# Patient Record
Sex: Male | Born: 1985
Health system: Southern US, Community
[De-identification: ages and names within clinical notes are randomized; demographics above are authoritative.]

---

## 2011-09-19 ENCOUNTER — Ambulatory Visit: Payer: Self-pay

## 2011-10-22 ENCOUNTER — Ambulatory Visit: Payer: Self-pay | Admitting: Otolaryngology

## 2011-10-28 ENCOUNTER — Ambulatory Visit: Payer: Self-pay | Admitting: Unknown Physician Specialty

## 2011-10-28 LAB — BASIC METABOLIC PANEL
Anion Gap: 7 (ref 7–16)
Co2: 28 mmol/L (ref 21–32)
EGFR (African American): >60
EGFR (Non-African Amer.): >60
Glucose: 90 mg/dL (ref 65–99)
Sodium: 139 mmol/L (ref 136–145)

## 2011-10-28 LAB — CBC
HGB: 15.1 g/dL (ref 13.0–18.0)
MCH: 30.2 pg (ref 26.0–34.0)
MCHC: 34.5 g/dL (ref 32.0–36.0)
RDW: 12.6 % (ref 11.5–14.5)

## 2011-10-28 LAB — PROTIME-INR: INR: 0.9

## 2011-10-28 LAB — APTT: Activated PTT: 32.1 secs (ref 23.6–35.9)

## 2011-10-31 ENCOUNTER — Ambulatory Visit: Payer: Self-pay | Admitting: Otolaryngology

## 2011-10-31 LAB — BASIC METABOLIC PANEL
BUN: 12 mg/dL (ref 7–18)
Calcium, Total: 9.4 mg/dL (ref 8.5–10.1)
Chloride: 102 mmol/L (ref 98–107)
Creatinine: 1.01 mg/dL (ref 0.60–1.30)
EGFR (African American): >60
EGFR (Non-African Amer.): >60
Potassium: 3.6 mmol/L (ref 3.5–5.1)

## 2011-10-31 LAB — CBC WITH DIFFERENTIAL/PLATELET
Eosinophil #: 0.3 10*3/uL (ref 0.0–0.7)
Eosinophil %: 3.9 %
HCT: 43.7 % (ref 40.0–52.0)
Lymphocyte #: 2.3 10*3/uL (ref 1.0–3.6)
Lymphocyte %: 28.4 %
MCV: 87 fL (ref 80–100)
Monocyte %: 8.2 %
Neutrophil #: 4.7 10*3/uL (ref 1.4–6.5)
Neutrophil %: 58.7 %
Platelet: 276 10*3/uL (ref 150–440)
RBC: 5.02 10*6/uL (ref 4.40–5.90)
WBC: 8 10*3/uL (ref 3.8–10.6)

## 2011-10-31 LAB — PROTIME-INR
INR: 0.9
Prothrombin Time: 12.3 secs (ref 11.5–14.7)

## 2011-10-31 LAB — APTT: Activated PTT: 33.7 secs (ref 23.6–35.9)

## 2011-10-31 LAB — PLATELET FUNCTION ASSAY: COL/EPI PLT FXN SCRN: 152 Seconds — ABNORMAL HIGH

## 2012-04-16 ENCOUNTER — Inpatient Hospital Stay: Payer: Self-pay | Admitting: Surgery

## 2012-04-16 LAB — COMPREHENSIVE METABOLIC PANEL
Albumin: 4.1 g/dL (ref 3.4–5.0)
Alkaline Phosphatase: 112 U/L (ref 50–136)
Anion Gap: 8 (ref 7–16)
Creatinine: 0.99 mg/dL (ref 0.60–1.30)
SGOT(AST): 22 U/L (ref 15–37)
SGPT (ALT): 26 U/L (ref 12–78)
Sodium: 144 mmol/L (ref 136–145)
Total Protein: 7.8 g/dL (ref 6.4–8.2)

## 2012-04-16 LAB — CBC WITH DIFFERENTIAL/PLATELET
Basophil %: 0.7 %
Eosinophil #: 0.1 10*3/uL (ref 0.0–0.7)
HGB: 15.9 g/dL (ref 13.0–18.0)
Lymphocyte #: 2.4 10*3/uL (ref 1.0–3.6)
Lymphocyte %: 27.4 %
MCH: 30 pg (ref 26.0–34.0)
MCV: 88 fL (ref 80–100)
Monocyte %: 5.4 %
Neutrophil #: 5.6 10*3/uL (ref 1.4–6.5)
Neutrophil %: 65 %
RBC: 5.28 10*6/uL (ref 4.40–5.90)

## 2012-04-17 LAB — CBC WITH DIFFERENTIAL/PLATELET
Basophil %: 0.6 %
Eosinophil %: 2.2 %
HCT: 40.5 % (ref 40.0–52.0)
Lymphocyte #: 2.4 10*3/uL (ref 1.0–3.6)
MCH: 29.7 pg (ref 26.0–34.0)
MCV: 87 fL (ref 80–100)
Monocyte #: 0.5 x10 3/mm (ref 0.2–1.0)
Neutrophil #: 4.6 10*3/uL (ref 1.4–6.5)
Platelet: 207 10*3/uL (ref 150–440)
RBC: 4.66 10*6/uL (ref 4.40–5.90)

## 2012-12-08 ENCOUNTER — Ambulatory Visit: Payer: Self-pay | Admitting: Internal Medicine

## 2012-12-13 LAB — BASIC METABOLIC PANEL
Calcium, Total: 9.1 mg/dL (ref 8.5–10.1)
Chloride: 104 mmol/L (ref 98–107)
Co2: 33 mmol/L — ABNORMAL HIGH (ref 21–32)
EGFR (African American): >60
EGFR (Non-African Amer.): >60
Glucose: 77 mg/dL (ref 65–99)
Osmolality: 274 (ref 275–301)
Potassium: 4.1 mmol/L (ref 3.5–5.1)
Sodium: 139 mmol/L (ref 136–145)

## 2012-12-14 ENCOUNTER — Inpatient Hospital Stay: Payer: Self-pay | Admitting: Cardiothoracic Surgery

## 2012-12-14 LAB — APTT: Activated PTT: 30.2 secs (ref 23.6–35.9)

## 2012-12-14 LAB — CBC WITH DIFFERENTIAL/PLATELET
Basophil #: 0.1 10*3/uL (ref 0.0–0.1)
Eosinophil #: 0.1 10*3/uL (ref 0.0–0.7)
Eosinophil %: 1.6 %
HGB: 14.2 g/dL (ref 13.0–18.0)
Lymphocyte #: 2.2 10*3/uL (ref 1.0–3.6)
Lymphocyte %: 34.2 %
MCHC: 35.8 g/dL (ref 32.0–36.0)
Monocyte #: 0.5 x10 3/mm (ref 0.2–1.0)
Monocyte %: 8.3 %
Neutrophil %: 54.6 %
Platelet: 219 10*3/uL (ref 150–440)
RDW: 12.5 % (ref 11.5–14.5)

## 2012-12-14 LAB — PROTIME-INR
INR: 1
Prothrombin Time: 13.2 secs (ref 11.5–14.7)

## 2012-12-16 LAB — CBC WITH DIFFERENTIAL/PLATELET
Basophil #: 0 10*3/uL (ref 0.0–0.1)
Basophil %: 0.5 %
HCT: 37.7 % — ABNORMAL LOW (ref 40.0–52.0)
HGB: 13.4 g/dL (ref 13.0–18.0)
Lymphocyte #: 1.7 10*3/uL (ref 1.0–3.6)
Lymphocyte %: 20.6 %
MCH: 30 pg (ref 26.0–34.0)
MCV: 84 fL (ref 80–100)
Monocyte #: 0.8 x10 3/mm (ref 0.2–1.0)
Neutrophil #: 5.8 10*3/uL (ref 1.4–6.5)
Platelet: 203 10*3/uL (ref 150–440)
RBC: 4.48 10*6/uL (ref 4.40–5.90)
RDW: 12.5 % (ref 11.5–14.5)
WBC: 8.5 10*3/uL (ref 3.8–10.6)

## 2012-12-17 LAB — PATHOLOGY REPORT

## 2012-12-23 ENCOUNTER — Ambulatory Visit: Payer: Self-pay | Admitting: Cardiothoracic Surgery

## 2012-12-31 ENCOUNTER — Ambulatory Visit: Payer: Self-pay | Admitting: Cardiothoracic Surgery

## 2013-03-31 ENCOUNTER — Ambulatory Visit: Payer: Self-pay | Admitting: Cardiothoracic Surgery

## 2013-04-06 ENCOUNTER — Ambulatory Visit: Payer: Self-pay | Admitting: Cardiothoracic Surgery

## 2013-10-11 IMAGING — CR DG SHOULDER 3+V*L*
1 series · 4 of 4 positions shown · non-contrast
Comparison: none

REASON FOR EXAM: inju
COMMENTS:

PROCEDURE:     TIPACTI - TIPACTI SHOULDER LEFT COMPLETE  - September 19, 2011 [DATE]
RESULT:     No fracture, dislocation or other acute bony abnormality is
identified.

[Series 1: internal rotate · 0.17mm/px · 4 of 4 slices shown]
[im 1/4]
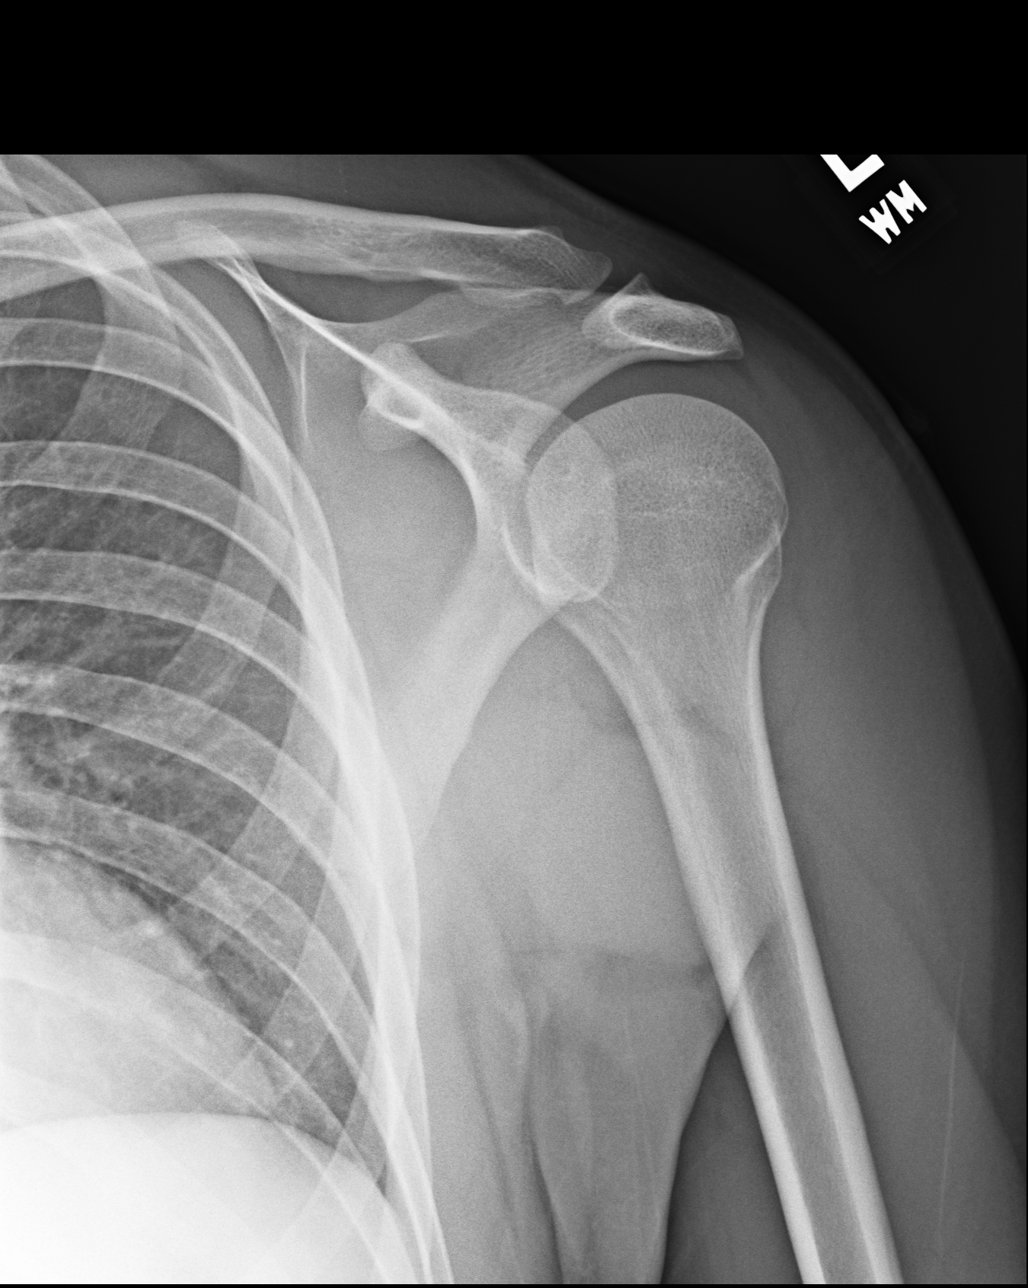
[im 2/4]
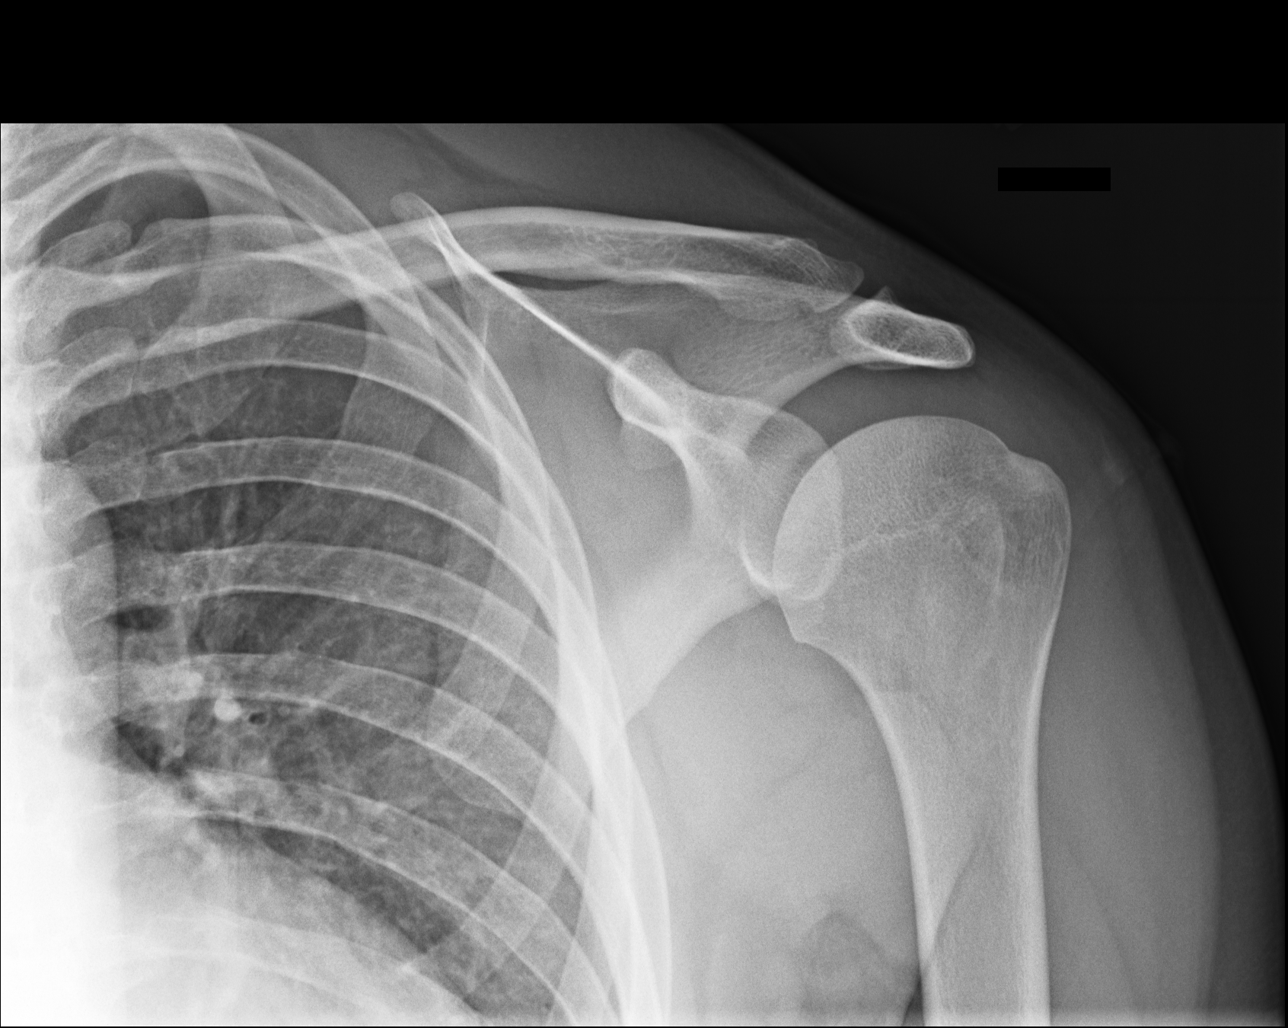
[im 3/4]
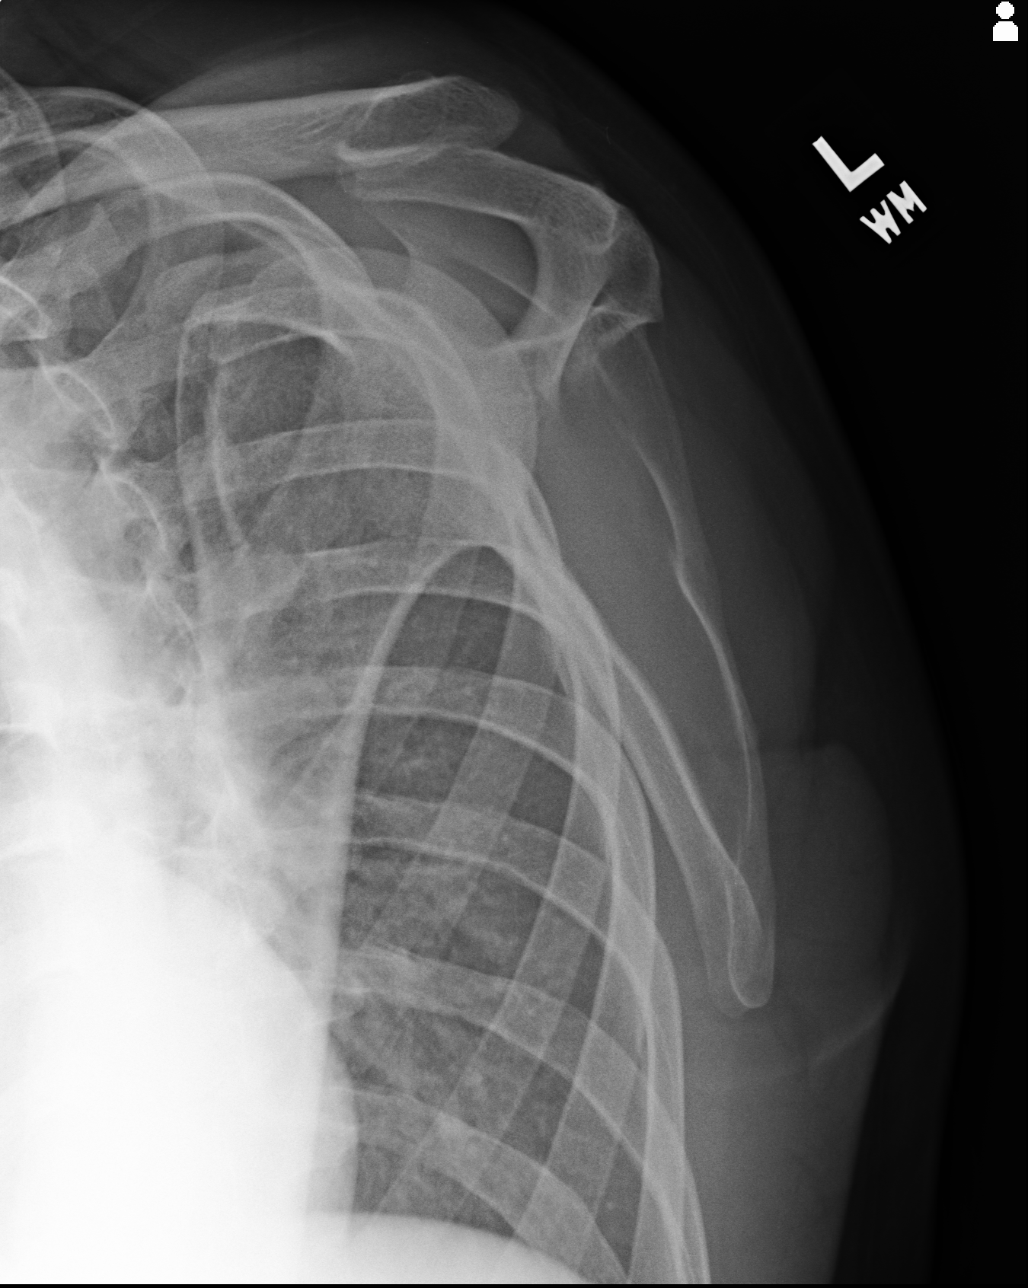
[im 4/4]
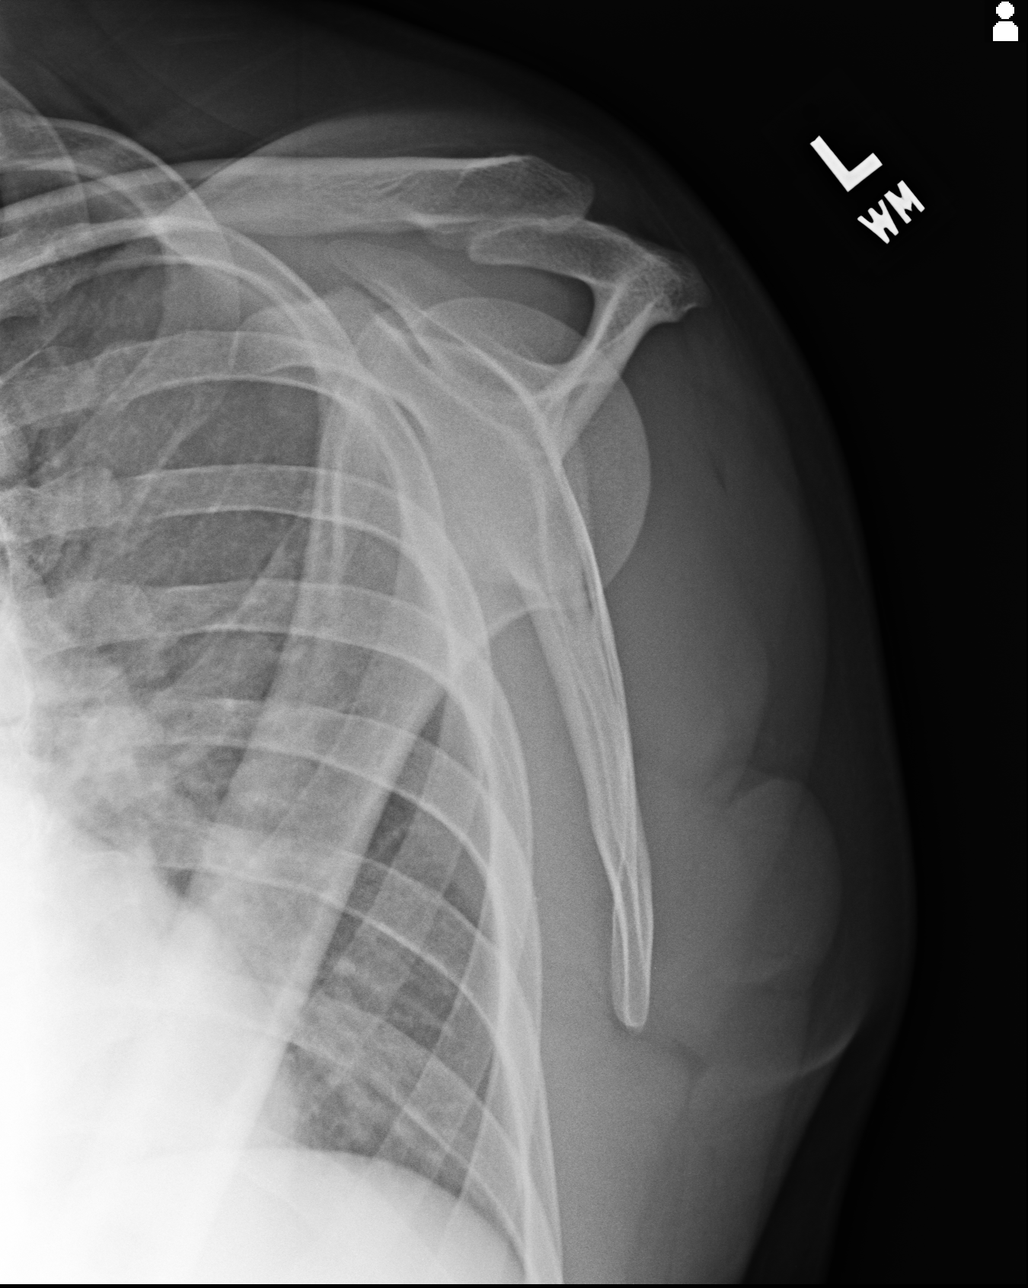

[4 of 4 positions shown; findings below may reference images not displayed]

IMPRESSION: No significant osseous abnormalities are noted.

## 2014-01-04 NOTE — Telephone Encounter (Signed)
This encounter was created in error - please disregard.

## 2014-10-24 NOTE — H&P (Signed)
PATIENT NAME:  George Horn, Dawson W MR#:  [id number] DATE OF BIRTH:  1986-03-05  DATE OF ADMISSION:  04/16/2012  CHIEF COMPLAINT: Right pneumothorax.   HISTORY OF PRESENT ILLNESS: This is a patient with 3 days of chest pain and shortness of breath, came to the emergency room because it was not going away. He denies hemoptysis. No nausea or vomiting, no fevers or chills. Has never had an episode like this before. A workup showed a right pneumothorax, and Dr. Cyril LoosenKinner has placed a chest tube on the right side. I was asked to see the patient for admission and management of his right chest tube.   Also of note, Dr. Cyril LoosenKinner withdrew the chest tube slightly because of back pain and the chest tube being in slightly too far. Currently the patient is comfortable.   PAST MEDICAL HISTORY: Anxiety.   PAST SURGICAL HISTORY: Complicated tonsillectomy and adenoidectomy with multiple surgeries. He has also had a wrist pinning for fracture.   ALLERGIES: None.   MEDICATIONS: Effexor.   FAMILY HISTORY: Noncontributory.   SOCIAL HISTORY: He is a nonsmoker, nondrinker. 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   REVIEW OF SYSTEMS: Ten system review has been performed and negative with the exception of that mentioned in the HPI.   PHYSICAL EXAMINATION:  GENERAL: Healthy but uncomfortable-appearing Caucasian male patient. He appears somewhat anxious.   VITAL SIGNS: Temperature of 98.6, pulse of 84, respirations 18, blood pressure 144/83, 99% room air sat.   HEENT: No scleral icterus.   NECK: No palpable neck nodes.   CHEST: Clear to auscultation.   CARDIAC: Regular rate and rhythm.   ABDOMEN: Soft, nontender.   EXTREMITIES: Without edema.   NEUROLOGIC: Grossly intact.   INTEGUMENT: No jaundice, and a right chest tube is present with no air leak noted.   Chest x-ray is reviewed both pre- and post-chest tube placement. The chest tube has been withdrawn slightly from the second film.    LABS: Laboratory values are noted as well.   ASSESSMENT AND PLAN: The patient with a spontaneous pneumothorax on the right.  Recommend admission, pain control, and re-examination with new chest film in the morning.  Consider tube removal based on the results of the chest x-ray in the morning.    ____________________________ Adah Salvageichard E. Excell Seltzerooper, MD rec:vtd D: 04/16/2012 18:47:16 ET T: 04/17/2012 07:03:04 ET JOB#: [id number]  cc: Adah Salvageichard E. Excell Seltzerooper, MD, <Dictator> Lattie HawICHARD E Mychael Smock MD ELECTRONICALLY SIGNED 04/17/2012 11:09

## 2014-10-24 NOTE — Consult Note (Signed)
PATIENT NAME:  George Horn, George Horn DATE OF BIRTH:  1986-03-19  DATE OF CONSULTATION:  04/19/2012  REFERRING PHYSICIAN:  Ida Roguehristopher Lundquist, MD  CONSULTING PHYSICIAN:  George Plumberimothy E. Thelma Bargeaks, MD  REASON FOR CONSULTATION: Management of spontaneous pneumothorax.   HISTORY OF PRESENT ILLNESS: I have personally seen and examined Mr. George Horn. I have independently reviewed all of his films. I have discussed his care with Dr. Ida Roguehristopher Horn.   George Horn is a 29 year old gentleman who was vacationing in [place] last week and was performing some exercising techniques when he experienced some right-sided chest pain. This persisted over the next two days and was associated with back pain and some shortness of breath. When he came to work on Friday he told several of his coworkers and they thought that perhaps his symptoms were suggestive of pneumonia so he went to an Urgent Care Center where an x-ray was obtained revealing a right-sided pneumothorax. Initially the patient states he did not have any fevers or chills. He did think that he initially had injured his chest in some way doing some lifting but in retrospect this was probably related to his pneumothorax. He had a chest tube inserted and the patient was admitted to the hospital for management. Yesterday an attempt was made to remove the tube, however, he experienced a pneumothorax on water seal and the tube was placed back on suction.   The patient has no prior history of collapsed lung. He has had no prior CT scans or x-rays for comparison. There is no family history of any problems.   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 He does not drink or smoke. He has a 29-year-old son. His girlfriend was present with him in the room and asked appropriate questions.   REVIEW OF SYSTEMS: As per history of present illness and all other review of systems were negative.   PHYSICAL EXAMINATION:   GENERAL: Pleasant, well developed, well nourished gentleman  in no distress.   HEENT: Head normocephalic, atraumatic. Pupils are equal.   NECK: Supple without thyromegaly or adenopathy. There were no carotid bruits.   LUNGS: His lungs were clear bilaterally.   HEART: His heart was regular.   ABDOMEN: Soft, nontender, nondistended. There were no palpable masses. There was no hepatosplenomegaly.   EXTREMITIES: His extremities were without clubbing, cyanosis, or edema.   ASSESSMENT AND PLAN: I have independently reviewed the patient's chest x-rays. There is a right-sided pneumothorax present on the initial film which has resolved with placement of the chest tube. I see no blebs on the films.   I had a long discussion with the patient and his girlfriend today about the management of the first occurrence in a spontaneous pneumothorax. He understands that this is usually managed nonoperatively. I quoted him a 10% risk of recurrent pneumothorax if the first pneumothorax was treated nonoperatively. He understood that. At the present time I would continue to manage his pain as you are. I would encourage ambulation when possible. I would be happy to follow him up in my clinic upon hospital discharge. At the present time I do not see any indications for surgery.   Thank you very much for allowing me to participate in his care.   ____________________________ George Plumberimothy E. Thelma Bargeaks, MD teo:drc D: 04/19/2012 11:07:04 ET T: 04/19/2012 11:28:59 ET JOB#: [id number]  cc: Marcial Pacasimothy E. Thelma Bargeaks, MD, <Dictator> Jasmine DecemberIMOTHY E Maxxon Schwanke MD ELECTRONICALLY SIGNED 04/22/2012 8:31

## 2014-10-24 NOTE — Consult Note (Signed)
Brief Consult Note: Diagnosis: right sided primary spontaneous pneumothorax.   Patient was seen by consultant.   Consult note dictated.   Discussed with Attending MD.   Comments: Manage nonoperatively for now.  Electronic Signatures: Jasmine Decemberaks, Ida Uppal E (MD)  (Signed 14-Oct-13 11:02)  Authored: Brief Consult Note   Last Updated: 14-Oct-13 11:02 by Jasmine Decemberaks, Avrey Hyser E (MD)

## 2014-10-24 NOTE — Discharge Summary (Signed)
PATIENT NAME:  George Horn, George Horn MR#:  [id number] DATE OF BIRTH:  Jun 02, 1986  DATE OF ADMISSION:  04/16/2012 DATE OF DISCHARGE:  04/22/2012  ATTENDING PHYSICIAN: Dr. Ida Roguehristopher Everett Ehrler   DISCHARGE DIAGNOSES:  1. Spontaneous left pneumothorax.  2. Anxiety.  3. History of complicated tonsillectomy and adenoidectomy.  4. History of wrist pinning for fracture.   DISCHARGE MEDICATIONS:  1. Oxycodone 5 mg 1 to 3 tabs p.o. q.4 hours p.r.n. pain.  2. Tylenol 1000 mg p.o. q.8 hours x2 weeks.  3. Ibuprofen 600 mg p.o. q.8 hours x2 weeks.  4. Effexor 75 mg p.o. daily.   HOSPITAL COURSE: [name] was noted to have three days of chest pain, shortness of breath. He had an x-ray which showed a right pneumothorax. ED physician, Dr. Cyril LoosenKinner, put a chest tube on his right side and this expanded his lung fully. The lung was placed on water seal and he a recurrent pneumothorax on 10/13. He was placed to suction again. Dr. Thelma Bargeaks was consulted this time and plan was just to keep him the lung suctioned. We placed him on water seal on 04/20/2012. He had an x-ray on 10/16 which showed his lung up on water seal and chest tube was discontinued. He still had some pain control issues therefore we adjusted his pain medicines and he was discharged to home on 04/22/2012.   DISCHARGE INSTRUCTIONS: He is to return to clinic in one week. He is to call if he has fever greater than 101, nausea, vomiting, chest pain, shortness of breath.   ____________________________ Si Raiderhristopher A. Jolissa Kapral, MD cal:cms D: 04/27/2012 15:26:31 ET T: 04/27/2012 15:41:04 ET  JOB#: [id number] cc: Cristal Deerhristopher A. Zoraida Havrilla, MD, <Dictator>  Jarvis NewcomerHRISTOPHER A Aryana Wonnacott MD ELECTRONICALLY SIGNED 04/30/2012 16:31

## 2014-10-24 NOTE — H&P (Signed)
    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    Place of Living Home   Review of Systems:   Fever/Chills No    Cough No    Abdominal Pain No    Diarrhea No    Constipation No    Nausea/Vomiting No    SOB/DOE Yes    Chest Pain Yes    Dysuria No    Tolerating Diet Yes   Physical Exam:   GEN uncomfortable    HEENT pink conjunctivae    NECK supple    RESP normal resp effort  clear BS  rt chest tube, no air leak    CARD regular rate    ABD denies tenderness  soft    LYMPH negative axillae    EXTR negative edema    SKIN normal to palpation    PSYCH alert, A+O to time, place, person, anxious, agitated   Lab Results: Routine Hem:  11-Oct-13 16:56    WBC (CBC) 8.7   RBC (CBC) 5.28   Hemoglobin (CBC) 15.9   Hematocrit (CBC) 46.2   Platelet Count (CBC) 235   MCV 88   MCH 30.0   MCHC 34.3   RDW 13.7   Neutrophil % 65.0   Lymphocyte % 27.4   Monocyte % 5.4   Eosinophil % 1.5   Basophil % 0.7   Neutrophil # 5.6   Lymphocyte # 2.4   Monocyte # 0.5   Eosinophil # 0.1   Basophil # 0.1 (Result(s) reported on 16 Apr 2012 at 06:40PM.)   Radiology Results: XRay:    11-Oct-13 16:57, Chest PA and Lateral   Chest PA and Lateral   REASON FOR EXAM:    ptx  COMMENTS:       PROCEDURE: DXR - DXR CHEST PA (OR AP) AND LATERAL  - Apr 16 2012  4:57PM     RESULT: Comparison: None.    Findings:  The heart and mediastinum are within normal limits. There is a moderate   sized right pneumothorax. The left lung is clear.    IMPRESSION:   Moderate sized right pneumothorax.    Clinician aware at time of dictation.  Dictation Site: 8          Verified By:  Lewie ChamberOBERT L. SUBER, M.D., MD     Assessment/Admission Diagnosis rt PTX CT placed by Dr Cyril LoosenKinner and withdrawn sltly by Cyril LoosenKinner rec admit reexamine   Electronic Signatures: Lattie Hawooper, Dave Mannes E (MD)  (Signed 11-Oct-13 18:44)  Authored: CHIEF COMPLAINT and HISTORY, PAST MEDICAL/SURGIAL HISTORY, ALLERGIES, FAMILY AND SOCIAL HISTORY, REVIEW OF SYSTEMS, PHYSICAL EXAM, LABS, Radiology, ASSESSMENT AND PLAN   Last Updated: 11-Oct-13 18:44 by Lattie Hawooper, Faith Patricelli E (MD)

## 2014-10-27 NOTE — Consult Note (Signed)
PATIENT NAME:  George Horn, George Horn MR#:  [id number] DATE OF BIRTH:  21-Mar-1986  DATE OF CONSULTATION:  12/14/2012  REFERRING PHYSICIAN: Claude MangesWilliam F. Marterre, MD CONSULTING PHYSICIAN:  Sheppard Plumberimothy E. Vilas Edgerly, MD  REASON FOR CONSULTATION: Recurrent right-sided pneumothorax.   I have been asked to see George Horn by Dr. Duwaine MaxinWilliam Marterre regarding his recurrent spontaneous pneumothorax.   HISTORY OF PRESENT ILLNESS: George Horn is a 29 year old white male who was admitted in October of last year with a right-sided spontaneous pneumothorax and treated with a chest tube. He spent several days in the hospital and was discharged to home. He was in his usual state of health until several days ago, when he again experienced an acute onset of right-sided chest pain and shortness of breath. He was working out at [employer] in a similar environment in which he experienced his first pneumothorax, so he felt that he had a recurrent pneumothorax and did indeed present to the Emergency Department, where a chest x-ray showed a right-sided pneumothorax. A subsequent CT scan confirmed the presence of a pneumothorax, but no obvious bleb disease or other cause. He has been resting comfortably since his admission on Sunday and feels that his chest pain has improved. He does not complain of any significant shortness of breath at this time.   PAST MEDICAL HISTORY: Significant only for previous spontaneous pneumothorax in the right.   ALLERGIES: None.  MEDICATIONS: None. He occasionally takes Flonase as needed.   FAMILY HISTORY: Negative for any underlying lung disorders.   REVIEW OF SYSTEMS: A complete review of systems was asked and was negative.   PHYSICAL EXAMINATION:  GENERAL: Revealed a pleasant, well-developed, well-nourished gentleman in no acute distress.  NECK: Supple without thyromegaly or adenopathy. There were no carotid bruits.  LUNGS: Equal bilaterally.  HEART: Regular.  ABDOMEN: Soft and nontender. There were  normal bowel sounds.  EXTREMITIES: Without clubbing, cyanosis or edema.   ASSESSMENT AND PLAN: I have independently reviewed the patient's CT scan. I do not see any obvious blebs on the scan or any other emphysematous lung processes.   I explained to the patient the indications and risks of preoperative bronchoscopy, right thoracoscopy, possible thoracotomy and blebectomy with mechanical pleurodesis. We also discussed the nonoperative options. The patient has agreed to undergo surgery. Risks of bleeding, infection, air leak and death were all reviewed.    ____________________________ [name], MD teo:OSi D: 12/14/2012 10:47:00 ET T: 12/14/2012 11:01:48 ET JOB#: [id number]  cc: Claude MangesWilliam F. Marterre, MD [name], MD, <Dictator>  Jasmine DecemberIMOTHY E Reshunda Strider MD ELECTRONICALLY SIGNED 12/20/2012 11:25

## 2014-10-27 NOTE — Op Note (Signed)
PATIENT NAME:  George Horn, George Horn#:  [id number] DATE OF BIRTH:  June 08, 1986  DATE OF PROCEDURE:  12/15/2012  SURGEON: Jasmine Decemberimothy E Debora Stockdale, M.D.   ASSISTANT: Dionne Miloichard Cooper, MD   PREOPERATIVE DIAGNOSIS: Recurrent spontaneous pneumothorax, right side.   POSTOPERATIVE DIAGNOSIS: Recurrent spontaneous pneumothorax, right side.   OPERATION PERFORMED: 1.  Preoperative bronchoscopy to assess endobronchial anatomy.  2.  Right thoracoscopy with apical blebectomy.  3.  Mechanical pleurocentesis.   INDICATIONS FOR PROCEDURE: Horn. [name] is a 29 year old gentleman with 2 recurrent spontaneous pneumothoraces on the right side. He was treated first with a chest tube and second  nonoperatively. He had a chest CT done, which did not reveal any pathology, but because of his recurrent pneumothorax, we offered him the above-named procedure. He gave his informed consent.   DESCRIPTION OF PROCEDURE: The patient was brought to the operating suite and placed in the supine position. General endotracheal anesthesia was given with a double-lumen tube. Preoperative bronchoscopy was carried out. We could visualize well into the orifices all the lobar bronchi and saw no evidence of tumor. The double-lumen tube was positioned appropriately. The patient was then turned for right thoracoscopy. We made  three 15 mm ports in a triangular fashion on the lower aspect of the chest wall. We placed our camera port through the most inferior one. Using multiple instruments, we got a good look at all segments of the lung including the upper lobe, middle lobe and the lower lobe. There was no evidence of any blebs present on any of these. In the fissure between the upper and lower lobe posteriorly, there was some loculated fluid but there was no evidence of blebs. We elected to remove the apex of the lung, as there was one small area that looked like it had a thin lining over it. This was grasped with a lung clamp and then an endoscopic stapler was  used to excise the apex. Once this was complete, we then used a Bovie scratch pad to perform a mechanical pleurodesis. This was performed from the sympathetic trunk posteriorly to the mammary artery anteriorly and from the second rib down. The inferior portion near the diaphragm was difficult to get to with the Bovie scratch pad and this was left alone. A single chest tube was positioned through our most inferior port and brought out through a separate stab wound. All port sites were then closed with multiple layers of running absorbable sutures. The chest tube was secured with silk. The skin was closed with nylon. Sterile dressings were applied. The patient was then taken to the recovery room in stable condition.     ____________________________ [name], MD teo:cc D: 12/15/2012 16:22:23 ET T: 12/15/2012 22:13:37 ET JOB#: [id number]  cc: Marcial Pacasimothy E. Thelma Bargeaks, MD, <Dictator> Jasmine DecemberIMOTHY E Aasir Daigler MD ELECTRONICALLY SIGNED 12/20/2012 11:25

## 2014-10-27 NOTE — H&P (Signed)
PATIENT NAME:  George Horn, George Horn MR#:  [id number] DATE OF BIRTH:  1985/09/07  DATE OF ADMISSION:  12/11/2012  HISTORY OF PRESENT ILLNESS: George Horn is a [age] year old white male, who experienced some queasiness and right upper quadrant pain 3 days ago and underwent an ultrasound of his right upper quadrant, which was entirely normal. Today, he went to the gym and was playing basketball when he noticed some right chest pain and right upper back pain that was reminiscent of his spontaneous pneumothorax last October. He did not have any shortness of breath, but sought attention in [facility]. He has not had any fever or other symptoms of any kind.   PAST MEDICAL HISTORY: Previous spontaneous pneumothorax on the right in October 2013 requiring 6-day hospitalization and closed thoracostomy tube placement.   MEDICATIONS: Effexor 75 mg b.i.d., ibuprofen 600 mg q. 8 hours p.r.n., oxycodone 5 mg 1 to 3 q. 4 hours p.r.n. and Tylenol 1000 mg q. 8 hours p.r.n.   ALLERGIES: None.   REVIEW OF SYSTEMS: Negative for 10 systems except as mentioned in the history of present illness.   PHYSICAL EXAMINATION:  GENERAL: Reveals a pleasant, young, white male lying comfortably on the Emergency Department stretcher.  VITAL SIGNS: Height 5 feet 11 inches, weight 180 pounds, BMI 25.1. Temperature 98, pulse 77, respirations 20, blood pressure 138/82, oxygen saturation 100% on room air at rest.  HEENT: Pupils equally round and reactive to light. Extraocular movements intact. Sclerae anicteric. Oropharynx clear. Mucous membranes moist. Hearing intact to voice.  NECK: Supple with no thyroid enlargement, tracheal deviation or jugular venous distention.  HEART: Regular rate and rhythm with no murmurs or rubs.  LUNGS: Clear to auscultation with normal respiratory effort bilaterally.  ABDOMEN: Soft, nontender, nondistended with no palpable hepatosplenomegaly or other masses.  EXTREMITIES: No edema with normal capillary  refill bilaterally.   OTHER STUDIES: Chest x-ray shows a small (less than or equal to 10% or approximately 1 to 2 cm) right apical pneumothorax.   ASSESSMENT: Recurrent small right apical pneumothorax.   PLAN: Admit to the hospital for oxygen therapy and observation.   ____________________________ Claude MangesWilliam F. Feliza Diven, MD wfm:aw D: 12/11/2012 11:33:10 ET T: 12/11/2012 11:54:48 ET JOB#: [id number]  cc: Claude MangesWilliam F. Kyli Sorter, MD, <Dictator> Claude MangesWILLIAM F Devonia Farro MD ELECTRONICALLY SIGNED 12/11/2012 15:08

## 2014-10-27 NOTE — Discharge Summary (Signed)
PATIENT NAME:  George Horn, George Horn MR#:  [id number] DATE OF BIRTH:  03-21-1986  DATE OF ADMISSION:  12/11/2012 DATE OF DISCHARGE:  12/17/2012  ADMITTING DIAGNOSIS: Recurrent right-sided spontaneous pneumothorax.   DISCHARGE DIAGNOSIS: Recurrent right-sided spontaneous pneumothorax.  OPERATION PERFORMED:  Preoperative bronchoscopy with right thoracoscopy and apical blebectomy with mechanical pleurodesis (12/15/2012).   HOSPITAL COURSE: Mr. George Horn is a 29 year old white male who was admitted to the hospital on 12/11/2012 with a chief complaint of chest pain and shortness of breath. A chest x-ray showed a recurrent right-sided pneumothorax. He had a CT scan performed which revealed a small right-sided pneumothorax without any obvious pathology. He was apprised of the indications and risks of surgery and since this was his second pneumothorax in the last year he opted to undergo surgical intervention. He was taken to the operating room on 12/15/2012 at which time he underwent a right thoracoscopy with apical blebectomy and mechanical pleurodesis. No obvious bleb disease was identified at the time of his surgery. Final pathology revealed lung with emphysematous changes and a focal area consistent with blebs. In addition, there was a nonspecific interstitial chronic inflammation and hemorrhage. The patient was nursed on the floor for several days and his chest tube was removed on Friday and a subsequent chest x-ray was obtained. After removal of his chest tube there was no pneumothorax. The patient was discharged to home with instructions to return in 1 week. He will be seen in the outpatient department with another chest x-ray. At the time of discharge, his medications included Norco 7.5/325 mg 1 to 2 tabs every 4 hours as needed.  ____________________________ [name], MD teo:sb D: 12/20/2012 11:22:00 ET T: 12/20/2012 12:10:04 ET JOB#: [id number]  cc: Marcial Pacasimothy E. Thelma Bargeaks, MD, <Dictator> Jasmine DecemberIMOTHY E Domingue Coltrain  MD ELECTRONICALLY SIGNED 12/28/2012 11:01

## 2014-10-29 NOTE — Consult Note (Signed)
PATIENT NAME:  George Horn, [name] MR#:  [id number] DATE OF BIRTH:  [birth year]  DATE OF CONSULTATION:  10/28/2011  REFERRING PHYSICIAN:  Maricela BoLuna Ragsdale, MD CONSULTING PHYSICIAN:  Davina Pokehapman T. Aleister Lady, MD  REASON FOR CONSULTATION: Postoperative bleeding.   HISTORY OF PRESENT ILLNESS: This is a 29 year old gentleman who underwent tonsillectomy by Dr. Andee PolesVaught last Wednesday and began having some bleeding earlier today. He gurgled with some ice water, but it did not stop. He has continued to have bleeding. He presents to the emergency room where he has been coughing and spitting up blood essentially nonstop since his arrival here.   PAST MEDICAL HISTORY: Unremarkable.   REVIEW OF SYSTEMS: Noncontributory.  LABORATORY DATA: Pending at this time.   MEDICATIONS: Routine postop, no anticoagulants.   PHYSICAL EXAMINATION: The external ears appear normal. The anterior nose is clear. Oral cavity and oropharynx shows a large clot with obvious bleeding from the right tonsillar fossa. There is also some clot apparent in the left tonsillar fossa as well. The patient is actively bleeding and coughing up blood.   IMPRESSION: Postoperative bleed.            PLAN: We will take him emergently to the operating room for control of postoperative hemorrhage.  ____________________________ Davina Pokehapman T. Jameis Newsham, MD ctm:slb D: 10/28/2011 19:26:28 ET T: 10/29/2011 08:19:53 ET JOB#: [id number]  cc: Davina Pokehapman T. Tarrin Lebow, MD, <Dictator> Davina PokeHAPMAN T Sharie Amorin MD ELECTRONICALLY SIGNED 11/13/2011 10:40

## 2014-10-29 NOTE — Op Note (Signed)
PATIENT NAME:  George Horn, [name] MR#:  [id number] DATE OF BIRTH:  04/13/1986  DATE OF PROCEDURE:  10/28/2011  PREOPERATIVE DIAGNOSIS: Postoperative hemorrhage.  POSTOPERATIVE DIAGNOSIS: Postoperative hemorrhage.  PROCEDURE PERFORMED: Cautery of postoperative hemorrhage.   SURGEON: Davina Pokehapman T. Marlene Pfluger, M.D.   OPERATIVE FINDINGS: Bleeding from the right superior pole tonsillar bed.   DESCRIPTION OF PROCEDURE: Gabriel RungJoe was identified in the holding area and taken to the operating room and placed in the supine position. After general endotracheal anesthesia, the table was turned 90 degrees. The patient was draped in the usual fashion for tonsillectomy. A mouth gag was inserted in the oral cavity. There was a large clot identified in the right tonsillar fossa. This was suctioned free. There was obvious bleeding vessel from this bed. This was cauterized using the suction cautery. There were several other bleeding points around this area which were also cauterized. The left side was then examined. There was some scar and clot and scab there which was scraped free. There was no bleeding from the left side. The oral cavity and oropharynx were then copiously irrigated with saline, reinspected with suction Bovie, and using the Hurd retractor showed no evidence of active bleeding. With no bleeding, a [place] sump was placed into the stomach and suctioned any old blood out. One more reinspection the tonsillar fossa showed no active bleeding. A local anesthetic of 0.5% plain Marcaine was used to inject the anterior and posterior tonsillar pillars bilaterally. A total of 6 mL was used. The patient was then returned to anesthesia where he was extubated in the operating room and taken to the recovery room in stable condition.   CULTURES: None.         SPECIMENS: None.   ESTIMATED BLOOD LOSS: Approximately 50 mL. ____________________________ Davina Pokehapman T. Carmin Dibartolo, MD ctm:slb D: 10/28/2011 20:56:59 ET T: 10/29/2011  08:42:31 ET JOB#: [id number]  cc: Davina Pokehapman T. Vilda Zollner, MD, <Dictator> Davina PokeHAPMAN T Tabia Landowski MD ELECTRONICALLY SIGNED 11/13/2011 10:40

## 2014-10-29 NOTE — Op Note (Signed)
PATIENT NAME:  George Horn, [name] MR#:  [id number] DATE OF BIRTH:  September 05, 1985  DATE OF PROCEDURE:  10/31/2011  PREOPERATIVE DIAGNOSIS: Post-tonsillectomy hemorrhage.  POSTOPERATIVE DIAGNOSIS: Post-tonsillectomy hemorrhage.  PROCEDURE PERFORMED: Intraoperative control of post-tonsillectomy hemorrhage.  SURGEON: Zackery BarefootJ. Madison Kalyan Barabas, MD  DESCRIPTION OF PROCEDURE: The patient was placed in the supine position, on the operating room table. After general endotracheal anesthesia had been induced, the patient was turned 90 degrees clockwise from anesthesia and in a supine position the tonsil gag was placed and suspended from the Mayo stand. There were large clots in the oropharynx and adherent clot in the left tonsillar fossa. After irrigation and suctioning out of these clots, there was a "pumper", at the superior tonsillar pole. Initially suction cautery was used, but there was still bleeding around the cautery, therefore, a 3-0 Vicryl figure-of-eight suture was placed. This stopped the bleeding. There was a little bit of more venous bleeding from the superior tonsillar pole as well, this was cauterized, and a small amount of bleeding from the posterior pillar on the right. This was stopped with suction cautery. The oropharynx was irrigated. It was clear. A Valsalva maneuver was then given by anesthesia and no additional bleeding was encountered. A sump suction was placed in the stomach and approximately 300 mL of dark blood was suctioned out of the stomach. The oropharynx was again suctioned. The patient was returned to anesthesia, allowed to emerge from anesthesia in the operating room, and taken to the recovery room in stable condition. No complications. Estimated blood loss 25 mL. ____________________________ J. Gertie BaronMadison Almer Littleton, MD jmc:slb D: 10/31/2011 06:20:55 ET     T: 10/31/2011 10:35:03 ET        JOB#: [id number] cc: Zackery BarefootJ. Madison Zyanya Glaza, MD, <Dictator> Wendee CoppJMADISON Isabella Ida MD ELECTRONICALLY SIGNED 11/05/2011 18:29

## 2015-04-23 IMAGING — CT CT CHEST W/O CM
1 series · 16 of 32 positions shown, 20 images · non-contrast
Comparison: none

REASON FOR EXAM: Lung nodule
COMMENTS:

[Series 2: soft tissue · axial · 0.75mm/px · z∈[-718,-400]mm · 16 of 116 slices shown, 20 images]
[im 5/116  mediastinal]
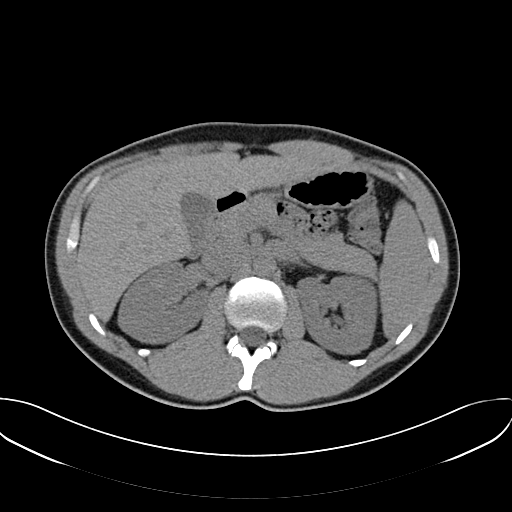
[im 5/116  lung]
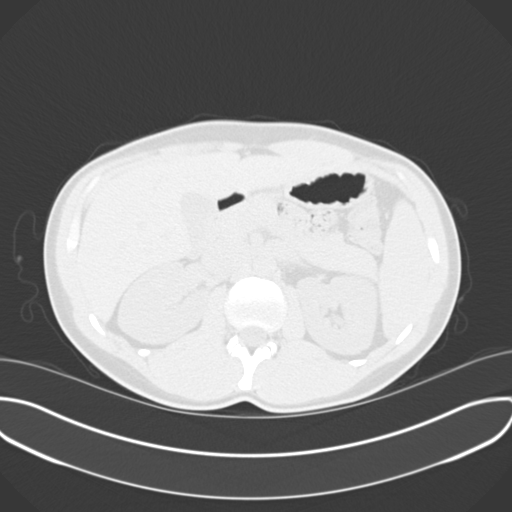
[im 13/116  lung]
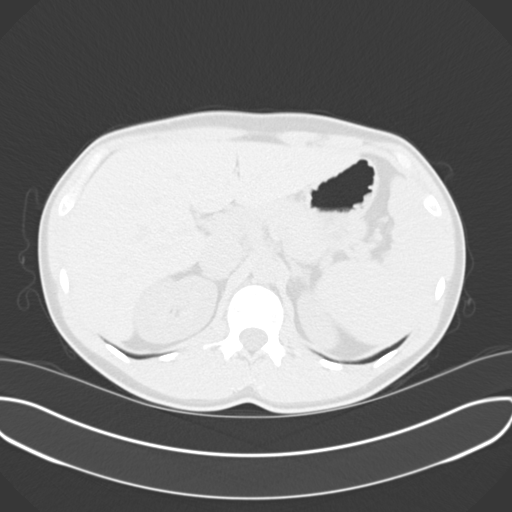
[im 22/116  lung]
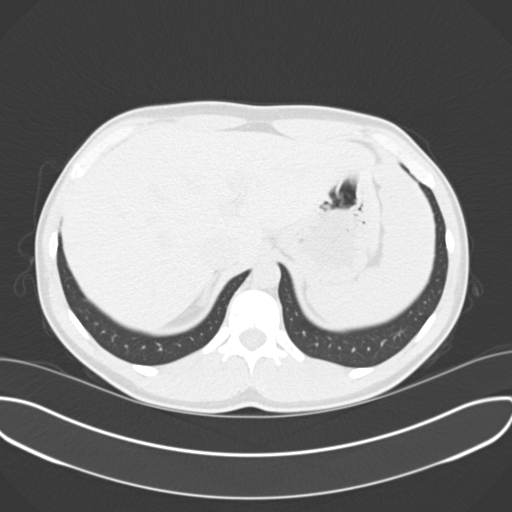
[im 26/116  lung]
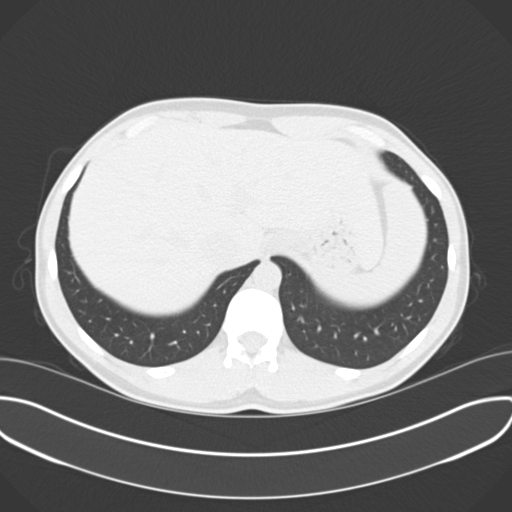
[im 35/116  mediastinal]
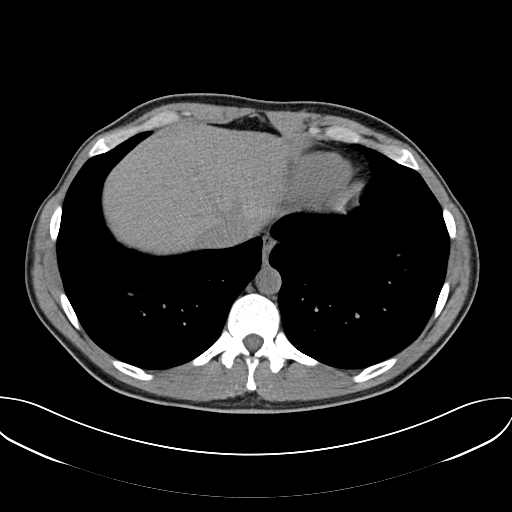
[im 35/116  lung]
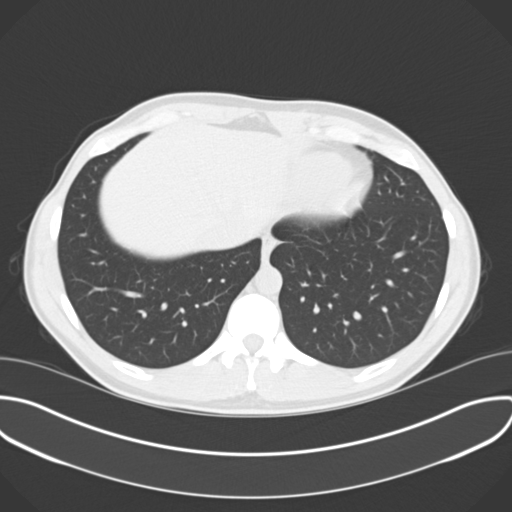
[im 43/116  lung]
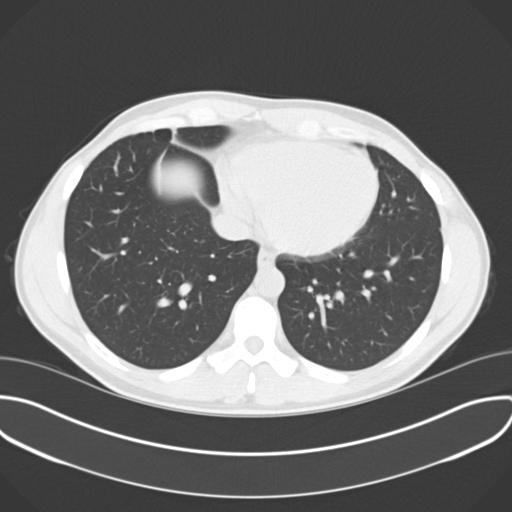
[im 52/116  lung]
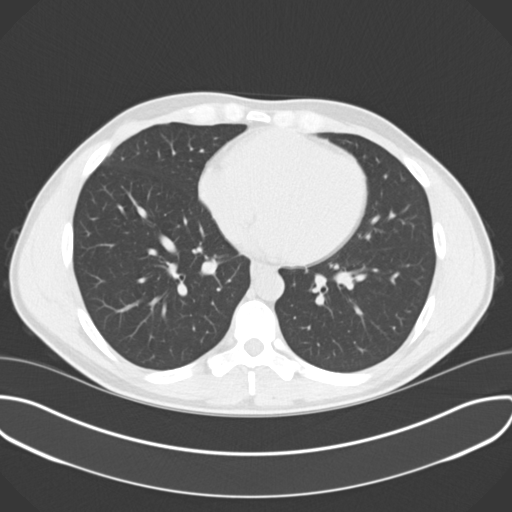
[im 60/116  lung]
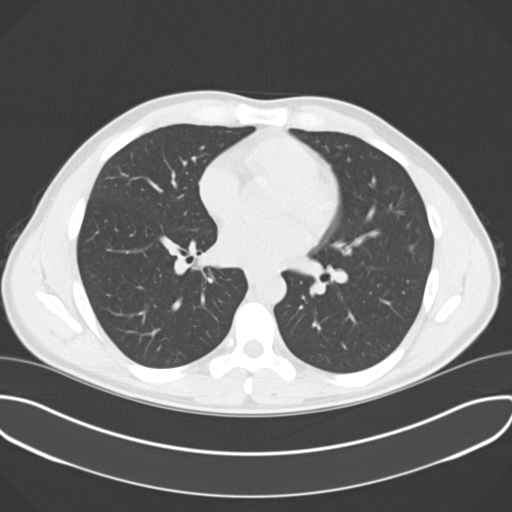
[im 61/116  mediastinal]
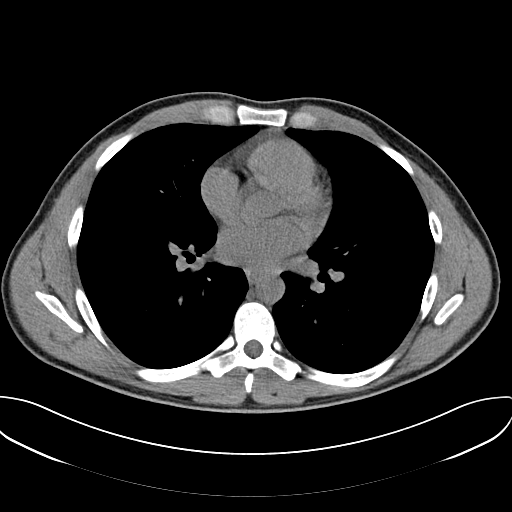
[im 61/116  lung]
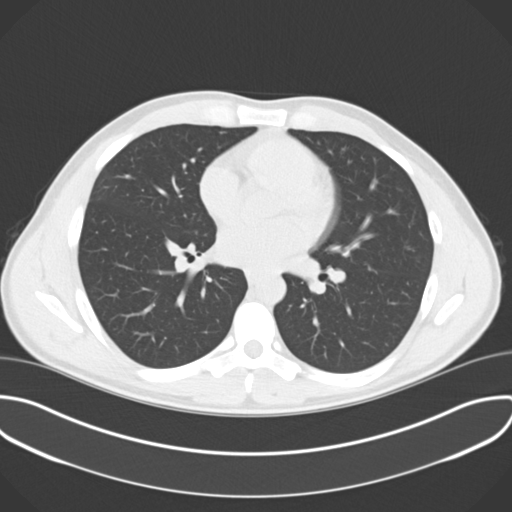
[im 69/116  lung]
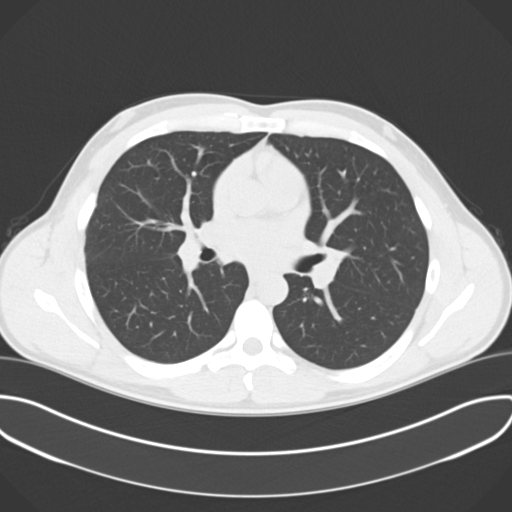
[im 73/116  lung]
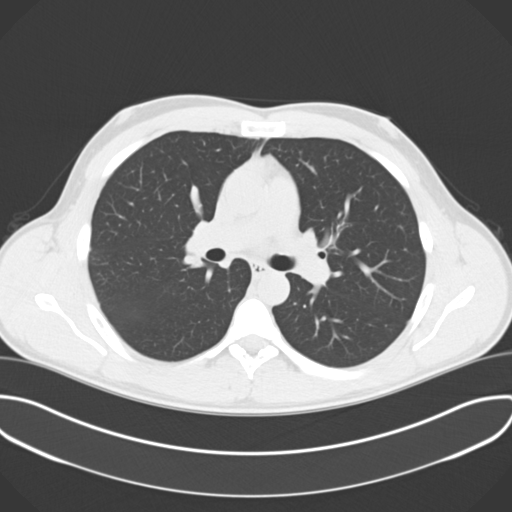
[im 81/116  lung]
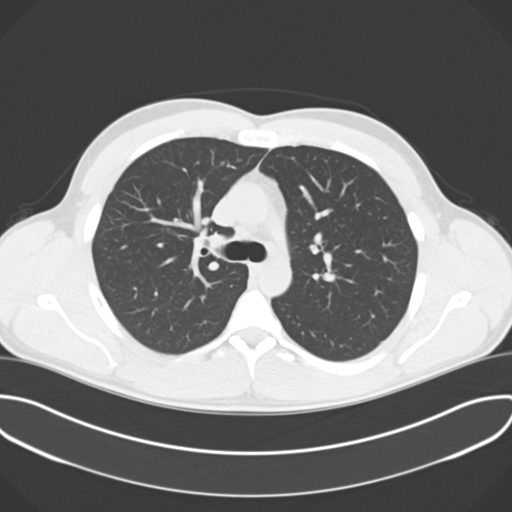
[im 90/116  mediastinal]
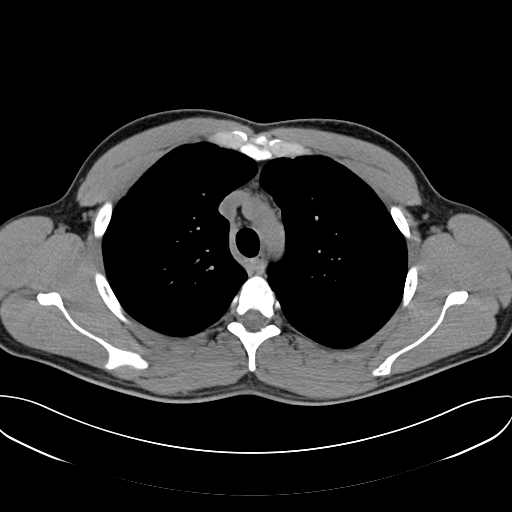
[im 90/116  lung]
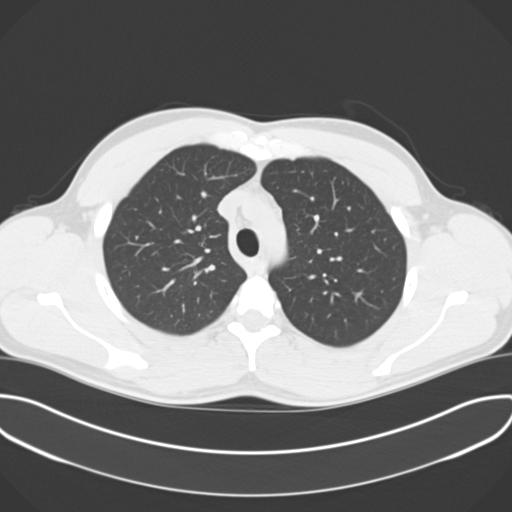
[im 94/116  lung]
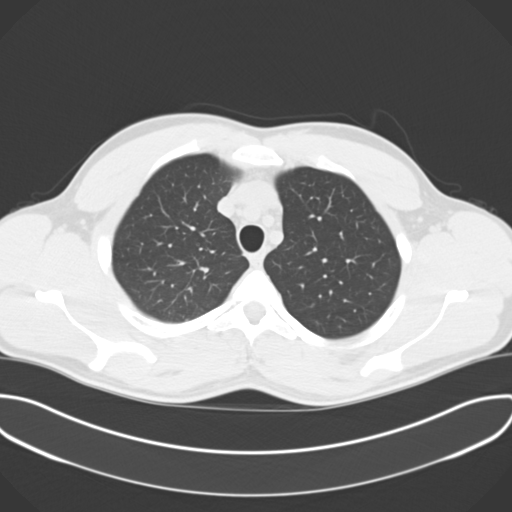
[im 103/116  lung]
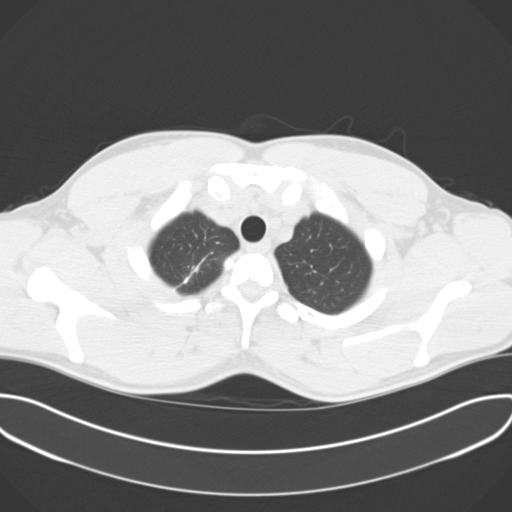
[im 111/116  lung]
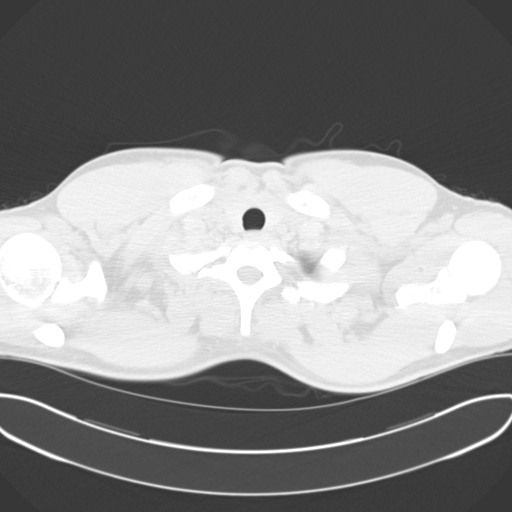

[16 of 32 positions shown; findings below may reference images not displayed]

PROCEDURE:     CT  - CT CHEST WITHOUT CONTRAST  - March 31, 2013  [DATE]

RESULT:     CT without contrast is reconstructed at 3.0 mm slice thickness
in the axial plane with comparison made to the study 12/13/2012.

The small right pneumothorax has resolved. There is linear and slightly
nodular increased density in the right upper lobe posteriorly extending to
the apex most likely fibrotic. No discrete pulmonary masses appreciated. No
endobronchial lesion is evident. There is no pleural or pericardial
effusion. The heart is normal. There is no mediastinal or hilar mass or
adenopathy. The included upper abdominal structures appear unremarkable. The
bony structures appear within normal limits.
IMPRESSION: 1. Right lung apical presumed linear fibrosis.

[REDACTED]
